# Patient Record
Sex: Male | Born: 1990 | Race: White | Hispanic: No | Marital: Single | Smoking: Current every day smoker
Health system: Southern US, Community
[De-identification: ages and names within clinical notes are randomized; demographics above are authoritative.]

## PROBLEM LIST (undated history)

## (undated) DIAGNOSIS — H9192 Unspecified hearing loss, left ear: Secondary | ICD-10-CM

## (undated) DIAGNOSIS — N049 Nephrotic syndrome with unspecified morphologic changes: Secondary | ICD-10-CM

## (undated) HISTORY — PX: FACIAL RECONSTRUCTION SURGERY: SHX631

---

## 2015-09-24 ENCOUNTER — Encounter (HOSPITAL_COMMUNITY): Payer: Self-pay

## 2015-09-24 ENCOUNTER — Emergency Department (HOSPITAL_COMMUNITY)
Admission: EM | Admit: 2015-09-24 | Discharge: 2015-09-25 | Disposition: A | Discharge status: Home / Self Care | Payer: No Typology Code available for payment source | Attending: Emergency Medicine | Admitting: Emergency Medicine

## 2015-09-24 DIAGNOSIS — Y998 Other external cause status: Secondary | ICD-10-CM | POA: Diagnosis not present

## 2015-09-24 DIAGNOSIS — Y92410 Unspecified street and highway as the place of occurrence of the external cause: Secondary | ICD-10-CM | POA: Diagnosis not present

## 2015-09-24 DIAGNOSIS — H9192 Unspecified hearing loss, left ear: Secondary | ICD-10-CM | POA: Diagnosis not present

## 2015-09-24 DIAGNOSIS — S59911A Unspecified injury of right forearm, initial encounter: Secondary | ICD-10-CM | POA: Insufficient documentation

## 2015-09-24 DIAGNOSIS — Z87448 Personal history of other diseases of urinary system: Secondary | ICD-10-CM | POA: Insufficient documentation

## 2015-09-24 DIAGNOSIS — F17210 Nicotine dependence, cigarettes, uncomplicated: Secondary | ICD-10-CM | POA: Insufficient documentation

## 2015-09-24 DIAGNOSIS — Y9389 Activity, other specified: Secondary | ICD-10-CM | POA: Insufficient documentation

## 2015-09-24 DIAGNOSIS — S3992XA Unspecified injury of lower back, initial encounter: Secondary | ICD-10-CM | POA: Insufficient documentation

## 2015-09-24 DIAGNOSIS — S161XXA Strain of muscle, fascia and tendon at neck level, initial encounter: Secondary | ICD-10-CM | POA: Diagnosis not present

## 2015-09-24 DIAGNOSIS — S79911A Unspecified injury of right hip, initial encounter: Secondary | ICD-10-CM | POA: Insufficient documentation

## 2015-09-24 DIAGNOSIS — S199XXA Unspecified injury of neck, initial encounter: Secondary | ICD-10-CM | POA: Diagnosis present

## 2015-09-24 DIAGNOSIS — S4991XA Unspecified injury of right shoulder and upper arm, initial encounter: Secondary | ICD-10-CM | POA: Diagnosis not present

## 2015-09-24 HISTORY — DX: Unspecified hearing loss, left ear: H91.92

## 2015-09-24 HISTORY — DX: Nephrotic syndrome with unspecified morphologic changes: N04.9

## 2015-09-24 MED ORDER — OXYCODONE-ACETAMINOPHEN 5-325 MG PO TABS
1.0000 | ORAL_TABLET | Freq: Once | ORAL | Status: AC
  Administered 2015-09-24: 1 via ORAL
  Filled 2015-09-24: qty 1

## 2015-09-24 MED ORDER — CYCLOBENZAPRINE HCL 10 MG PO TABS
10.0000 mg | ORAL_TABLET | Freq: Once | ORAL | Status: AC
Start: 2015-09-24 — End: 2015-09-24
  Administered 2015-09-24: 10 mg via ORAL
  Filled 2015-09-24: qty 1
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
## 2015-09-24 NOTE — ED Notes (Signed)
PA at bedside.

## 2015-09-25 ENCOUNTER — Encounter (HOSPITAL_COMMUNITY): Payer: Self-pay | Admitting: Radiology

## 2015-09-25 ENCOUNTER — Emergency Department (HOSPITAL_COMMUNITY): Payer: No Typology Code available for payment source

## 2015-09-25 MED ORDER — HYDROCODONE-ACETAMINOPHEN 5-325 MG PO TABS: 1.0000 | ORAL_TABLET | Freq: Four times a day (QID) | ORAL | Status: AC | PRN

## 2015-09-25 MED ORDER — CYCLOBENZAPRINE HCL 10 MG PO TABS: 10.0000 mg | ORAL_TABLET | Freq: Two times a day (BID) | ORAL | Status: AC | PRN

## 2015-09-25 MED ORDER — NAPROXEN 500 MG PO TABS: 500.0000 mg | ORAL_TABLET | Freq: Two times a day (BID) | ORAL | Status: AC

## 2015-09-25 MED ORDER — OXYCODONE-ACETAMINOPHEN 5-325 MG PO TABS
1.0000 | ORAL_TABLET | Freq: Once | ORAL | Status: AC
  Administered 2015-09-25: 1 via ORAL
  Filled 2015-09-25: qty 1

## 2015-09-25 NOTE — ED Notes (Signed)
Patient transported to CT 

## 2015-09-25 NOTE — Discharge Instructions (Signed)
Do not take the narcotic or muscle relaxant if driving because they will make you sleepy. Follow up with Dr. Magnus IvanBlackman if your pain continues. Return here as needed.
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
## 2017-11-03 ENCOUNTER — Other Ambulatory Visit: Payer: Self-pay

## 2017-11-03 ENCOUNTER — Emergency Department (HOSPITAL_COMMUNITY)
Admission: EM | Admit: 2017-11-03 | Discharge: 2017-11-03 | Disposition: A | Discharge status: Home / Self Care | Payer: Self-pay | Attending: Emergency Medicine | Admitting: Emergency Medicine

## 2017-11-03 ENCOUNTER — Encounter (HOSPITAL_COMMUNITY): Payer: Self-pay | Admitting: Emergency Medicine

## 2017-11-03 DIAGNOSIS — R109 Unspecified abdominal pain: Secondary | ICD-10-CM | POA: Insufficient documentation

## 2017-11-03 DIAGNOSIS — Z5321 Procedure and treatment not carried out due to patient leaving prior to being seen by health care provider: Secondary | ICD-10-CM | POA: Insufficient documentation

## 2017-11-03 LAB — URINALYSIS, ROUTINE W REFLEX MICROSCOPIC
BILIRUBIN URINE: NEGATIVE
GLUCOSE, UA: NEGATIVE mg/dL
HGB URINE DIPSTICK: NEGATIVE
Ketones, ur: NEGATIVE mg/dL
Leukocytes, UA: NEGATIVE
Nitrite: NEGATIVE
PROTEIN: NEGATIVE mg/dL
Specific Gravity, Urine: 1.006 (ref 1.005–1.030)
pH: 6 (ref 5.0–8.0)

## 2017-11-03 NOTE — ED Notes (Signed)
Called for room, no answer

## 2017-11-03 NOTE — ED Triage Notes (Signed)
Patient c/o generalized abd pain with nausea and vomiting. Denies any diarrhea or fevers. Patient reports taking pepto bismol with some relief.

## 2017-11-03 NOTE — ED Notes (Signed)
Pt not in the waiting area
